# Patient Record
Sex: Female | Born: 1940 | Race: Asian | Hispanic: No | Marital: Married | Smoking: Never smoker
Health system: Southern US, Community
[De-identification: ages and names within clinical notes are randomized; demographics above are authoritative.]

---

## 2004-10-31 ENCOUNTER — Ambulatory Visit: Payer: Self-pay | Admitting: Family Medicine

## 2004-10-31 ENCOUNTER — Ambulatory Visit: Payer: Self-pay | Admitting: *Deleted

## 2004-12-09 ENCOUNTER — Ambulatory Visit: Payer: Self-pay | Admitting: Family Medicine

## 2006-08-05 ENCOUNTER — Encounter: Admission: RE | Admit: 2006-08-05 | Discharge: 2006-08-05 | Payer: Self-pay | Admitting: Cardiovascular Disease

## 2012-10-10 ENCOUNTER — Inpatient Hospital Stay (HOSPITAL_COMMUNITY)
Admission: AD | Admit: 2012-10-10 | Discharge: 2012-10-12 | DRG: 293 | Disposition: A | Discharge status: Home / Self Care | Payer: Medicare Other | Source: Ambulatory Visit | Attending: Cardiovascular Disease | Admitting: Cardiovascular Disease

## 2012-10-10 DIAGNOSIS — I498 Other specified cardiac arrhythmias: Secondary | ICD-10-CM | POA: Diagnosis present

## 2012-10-10 DIAGNOSIS — M25569 Pain in unspecified knee: Secondary | ICD-10-CM | POA: Diagnosis present

## 2012-10-10 DIAGNOSIS — I10 Essential (primary) hypertension: Secondary | ICD-10-CM | POA: Diagnosis present

## 2012-10-10 DIAGNOSIS — I5031 Acute diastolic (congestive) heart failure: Principal | ICD-10-CM | POA: Diagnosis present

## 2012-10-10 DIAGNOSIS — I509 Heart failure, unspecified: Secondary | ICD-10-CM | POA: Diagnosis present

## 2012-10-10 DIAGNOSIS — Z6827 Body mass index (BMI) 27.0-27.9, adult: Secondary | ICD-10-CM

## 2012-10-10 DIAGNOSIS — R011 Cardiac murmur, unspecified: Secondary | ICD-10-CM | POA: Diagnosis present

## 2012-10-10 DIAGNOSIS — Z79899 Other long term (current) drug therapy: Secondary | ICD-10-CM

## 2012-10-10 DIAGNOSIS — M1710 Unilateral primary osteoarthritis, unspecified knee: Secondary | ICD-10-CM | POA: Diagnosis present

## 2012-10-10 DIAGNOSIS — Z7982 Long term (current) use of aspirin: Secondary | ICD-10-CM

## 2012-10-10 DIAGNOSIS — E669 Obesity, unspecified: Secondary | ICD-10-CM | POA: Diagnosis present

## 2012-10-10 LAB — CBC WITH DIFFERENTIAL/PLATELET
Basophils Relative: 0 % (ref 0–1)
Hemoglobin: 11.3 g/dL — ABNORMAL LOW (ref 12.0–15.0)
Lymphs Abs: 2.9 10*3/uL (ref 0.7–4.0)
Monocytes Relative: 6 % (ref 3–12)
Neutro Abs: 5.9 10*3/uL (ref 1.7–7.7)
Neutrophils Relative %: 61 % (ref 43–77)
RBC: 4.26 MIL/uL (ref 3.87–5.11)
WBC: 9.6 10*3/uL (ref 4.0–10.5)

## 2012-10-10 LAB — COMPREHENSIVE METABOLIC PANEL
Albumin: 3.9 g/dL (ref 3.5–5.2)
Alkaline Phosphatase: 60 U/L (ref 39–117)
BUN: 17 mg/dL (ref 6–23)
Chloride: 103 mEq/L (ref 96–112)
Glucose, Bld: 83 mg/dL (ref 70–99)
Potassium: 4.2 mEq/L (ref 3.5–5.1)
Total Bilirubin: 0.3 mg/dL (ref 0.3–1.2)

## 2012-10-10 LAB — TROPONIN I: Troponin I: <0.3 ng/mL (ref <0.30)

## 2012-10-10 LAB — PROTIME-INR: Prothrombin Time: 13.9 seconds (ref 11.6–15.2)

## 2012-10-10 MED ORDER — SODIUM CHLORIDE 0.9 % IJ SOLN
3.0000 mL | INTRAMUSCULAR | Status: DC | PRN
  Administered 2012-10-12: 3 mL via INTRAVENOUS

## 2012-10-10 MED ORDER — HEPARIN SODIUM (PORCINE) 5000 UNIT/ML IJ SOLN
5000.0000 [IU] | Freq: Three times a day (TID) | INTRAMUSCULAR | Status: DC
  Administered 2012-10-10 – 2012-10-12 (×6): 5000 [IU] via SUBCUTANEOUS
  Filled 2012-10-10 (×8): qty 1

## 2012-10-10 MED ORDER — POTASSIUM CHLORIDE CRYS ER 20 MEQ PO TBCR
20.0000 meq | EXTENDED_RELEASE_TABLET | Freq: Two times a day (BID) | ORAL | Status: DC
  Administered 2012-10-10 – 2012-10-12 (×4): 20 meq via ORAL
  Filled 2012-10-10 (×5): qty 1

## 2012-10-10 MED ORDER — SODIUM CHLORIDE 0.9 % IV SOLN: 250.0000 mL | INTRAVENOUS | Status: DC | PRN

## 2012-10-10 MED ORDER — SIMVASTATIN 20 MG PO TABS
20.0000 mg | ORAL_TABLET | Freq: Every day | ORAL | Status: DC
  Administered 2012-10-11 – 2012-10-12 (×2): 20 mg via ORAL
  Filled 2012-10-10 (×2): qty 1

## 2012-10-10 MED ORDER — ONDANSETRON HCL 4 MG/2ML IJ SOLN: 4.0000 mg | Freq: Four times a day (QID) | INTRAMUSCULAR | Status: DC | PRN

## 2012-10-10 MED ORDER — SODIUM CHLORIDE 0.9 % IJ SOLN
3.0000 mL | Freq: Two times a day (BID) | INTRAMUSCULAR | Status: DC
  Administered 2012-10-10 – 2012-10-12 (×4): 3 mL via INTRAVENOUS

## 2012-10-10 MED ORDER — ACETAMINOPHEN 325 MG PO TABS: 650.0000 mg | ORAL_TABLET | ORAL | Status: DC | PRN

## 2012-10-10 MED ORDER — FUROSEMIDE 10 MG/ML IJ SOLN
20.0000 mg | Freq: Two times a day (BID) | INTRAMUSCULAR | Status: DC
  Administered 2012-10-10 – 2012-10-12 (×5): 20 mg via INTRAVENOUS
  Filled 2012-10-10 (×4): qty 2

## 2012-10-10 MED ORDER — ASPIRIN EC 81 MG PO TBEC
81.0000 mg | DELAYED_RELEASE_TABLET | Freq: Every day | ORAL | Status: DC
  Administered 2012-10-10 – 2012-10-12 (×3): 81 mg via ORAL
  Filled 2012-10-10 (×4): qty 1

## 2012-10-10 MED ORDER — FUROSEMIDE 10 MG/ML IJ SOLN
INTRAMUSCULAR | Status: AC
  Filled 2012-10-10: qty 4

## 2012-10-10 MED ORDER — LOSARTAN POTASSIUM 50 MG PO TABS
50.0000 mg | ORAL_TABLET | Freq: Every day | ORAL | Status: DC
  Administered 2012-10-10 – 2012-10-12 (×3): 50 mg via ORAL
  Filled 2012-10-10 (×3): qty 1

## 2012-10-10 MED ORDER — CARVEDILOL 3.125 MG PO TABS
3.1250 mg | ORAL_TABLET | Freq: Two times a day (BID) | ORAL | Status: DC
  Administered 2012-10-11 – 2012-10-12 (×3): 3.125 mg via ORAL
  Filled 2012-10-10 (×5): qty 1

## 2012-10-10 NOTE — H&P (Signed)
Connie Rose is an 72 y.o. female.   Chief Complaint: Leg edema and shortness of breath. HPI: 72 years old female with 1 week history of leg edema and shortness of breath and shaky feeling. No fever. No cough.   Past medical history: No DM, II, + Hypertension. No smoking, No alcohol, no drugs.  No past surgical history on file.  Family history: non-contributory . Social History:  has no tobacco, alcohol, and drug use.  Allergies: Allergies not on file  No prescriptions prior to admission    No results found for this or any previous visit (from the past 48 hour(s)). No results found.  @ROS @ No weight gain, No dentures, No asthma, No chest pain, + shortness of breath. No GI bleed No kidney stone, No stroke or seizures or psych admission.  Blood pressure 151/72, pulse 59, temperature 97.6 F (36.4 C), temperature source Oral, resp. rate 20, height 5\' 4"  (1.626 m), weight 73.7 kg (162 lb 7.7 oz), SpO2 96.00%. GENERAL: She was afebrile. Well built and well nourished  HEENT: Brown eyes, Conjunctivae- pink. Sclera-non-icteric.  NECK: Supple. No JVD. No bruit.  LUNGS: Clear to auscultation. There was no rhonchi or rales.  CARDIOVASCULAR: Rhythm regular. S1 and S2 was normal. There was soft systolic murmur.  ABDOMEN: Soft. Bowel sounds were present. Nontender. Distended.  EXTREMITIES: No clubbing,or cyanosis. 2 + edema.    CNS: Cranial nerves grossly intact. Moves all 4 extremities.   Assessment/Plan Diastolic heart failure, acute Hypertension  Admit/IV lasix/Echocardiogram/R/O MI.  Orpah Cobb S 10/10/2012, 6:08 PM

## 2012-10-10 NOTE — Progress Notes (Signed)
Reported off to night shift RN.  Called down to service response for [employer] dinner tray. Nino Glow RN

## 2012-10-10 NOTE — Progress Notes (Signed)
Pt has been admitted to 4700 and has been placed on telemetry.  Pt currently Sinus Brady with pulse at 59, BP 151/72, Resp 20, 96% on RA.  EKG in progress as ordered.  Pt alert and oriented and appears in no acute distress.  Pt and pt family members have been oriented to unit and call bell is within reach.   Nino Glow RN

## 2012-10-11 ENCOUNTER — Encounter (HOSPITAL_COMMUNITY): Payer: Self-pay

## 2012-10-11 ENCOUNTER — Inpatient Hospital Stay (HOSPITAL_COMMUNITY): Payer: Medicare Other

## 2012-10-11 LAB — BASIC METABOLIC PANEL
CO2: 27 mEq/L (ref 19–32)
Calcium: 9.1 mg/dL (ref 8.4–10.5)
GFR calc Af Amer: 72 mL/min — ABNORMAL LOW (ref >90)
GFR calc non Af Amer: 62 mL/min — ABNORMAL LOW (ref >90)
Sodium: 140 mEq/L (ref 135–145)

## 2012-10-11 LAB — TROPONIN I
Troponin I: <0.3 ng/mL (ref <0.30)
Troponin I: <0.3 ng/mL (ref <0.30)

## 2012-10-11 LAB — TSH: TSH: 3.716 u[IU]/mL (ref 0.350–4.500)

## 2012-10-11 NOTE — Progress Notes (Signed)
Subjective:  Feeling better. Bilateral knee pain  Objective:  Vital Signs in the last 24 hours: Temp:  [97.6 F (36.4 C)-97.9 F (36.6 C)] 97.8 F (36.6 C) (06/17 0612) Pulse Rate:  [50-59] 56 (06/17 0612) Cardiac Rhythm:  [-] Sinus bradycardia (06/16 2014) Resp:  [20] 20 (06/17 0612) BP: (126-151)/(59-72) 140/59 mmHg (06/17 0612) SpO2:  [96 %-99 %] 97 % (06/17 0612) Weight:  [71.8 kg (158 lb 4.6 oz)-73.7 kg (162 lb 7.7 oz)] 71.8 kg (158 lb 4.6 oz) (06/17 0612)  Physical Exam: BP Readings from Last 1 Encounters:  10/11/12 140/59     Wt Readings from Last 1 Encounters:  10/11/12 71.8 kg (158 lb 4.6 oz)    Weight change:   HEENT: [place]/AT, Eyes-Brown, PERL, EOMI, Conjunctiva-Pink, Sclera-Non-icteric Neck: No JVD, No bruit, Trachea midline. Lungs:  Clear, Bilateral. Cardiac:  Regular rhythm, normal S1 and S2, no S3.  Abdomen:  Soft, non-tender. Extremities:  No edema present. No cyanosis. No clubbing. CNS: AxOx3, Cranial nerves grossly intact, moves all 4 extremities. Right handed. Skin: Warm and dry.   Intake/Output from previous day: 06/16 0701 - 06/17 0700 In: 60 [P.O.:60] Out: 1000 [Urine:1000]    Lab Results: BMET    Component Value Date/Time   NA 140 10/11/2012 0710   K 3.9 10/11/2012 0710   CL 102 10/11/2012 0710   CO2 27 10/11/2012 0710   GLUCOSE 106* 10/11/2012 0710   BUN 16 10/11/2012 0710   CREATININE 0.89 10/11/2012 0710   CALCIUM 9.1 10/11/2012 0710   GFRNONAA 62* 10/11/2012 0710   GFRAA 72* 10/11/2012 0710   CBC    Component Value Date/Time   WBC 9.6 10/10/2012 1921   RBC 4.26 10/10/2012 1921   HGB 11.3* 10/10/2012 1921   HCT 35.1* 10/10/2012 1921   PLT 246 10/10/2012 1921   MCV 82.4 10/10/2012 1921   MCH 26.5 10/10/2012 1921   MCHC 32.2 10/10/2012 1921   RDW 13.8 10/10/2012 1921   LYMPHSABS 2.9 10/10/2012 1921   MONOABS 0.6 10/10/2012 1921   EOSABS 0.3 10/10/2012 1921   BASOSABS 0.0 10/10/2012 1921   CARDIAC ENZYMES Lab Results  Component Value Date   TROPONINI <0.30 10/11/2012    Scheduled Meds: . aspirin EC  81 mg Oral Daily  . carvedilol  3.125 mg Oral BID WC  . furosemide      . furosemide  20 mg Intravenous Q12H  . heparin  5,000 Units Subcutaneous Q8H  . losartan  50 mg Oral Daily  . potassium chloride  20 mEq Oral BID  . simvastatin  20 mg Oral q1800  . sodium chloride  3 mL Intravenous Q12H   Continuous Infusions:  PRN Meds:.sodium chloride, acetaminophen, ondansetron (ZOFRAN) IV, sodium chloride  Assessment/Plan: Diastolic heart failure, acute  Hypertension Bilateral knee pain/arthritis  Awaiting 2-D echocardiogram.   LOS: 1 day    Orpah Cobb  MD  10/11/2012, 9:03 AM

## 2012-10-11 NOTE — Progress Notes (Signed)
  Echocardiogram 2D Echocardiogram has been performed.  Cathie Beams 10/11/2012, 12:18 PM

## 2012-10-11 NOTE — Progress Notes (Signed)
UR Chart Review Completed  

## 2012-10-11 NOTE — Progress Notes (Signed)
Patient walking in room today without assistance.  Patient had xray's done of chest, and bilateral knees today.  Diuresing patient today.  Patient tolerating well.

## 2012-10-12 ENCOUNTER — Inpatient Hospital Stay (HOSPITAL_COMMUNITY): Payer: Medicare Other

## 2012-10-12 LAB — BASIC METABOLIC PANEL
GFR calc Af Amer: 76 mL/min — ABNORMAL LOW (ref >90)
GFR calc non Af Amer: 66 mL/min — ABNORMAL LOW (ref >90)
Potassium: 4.1 mEq/L (ref 3.5–5.1)
Sodium: 136 mEq/L (ref 135–145)

## 2012-10-12 MED ORDER — TECHNETIUM TC 99M SESTAMIBI GENERIC - CARDIOLITE
10.0000 | Freq: Once | INTRAVENOUS | Status: AC | PRN
  Administered 2012-10-12: 10 via INTRAVENOUS

## 2012-10-12 MED ORDER — TECHNETIUM TC 99M SESTAMIBI - CARDIOLITE
30.0000 | Freq: Once | INTRAVENOUS | Status: AC | PRN
  Administered 2012-10-12: 30 via INTRAVENOUS

## 2012-10-12 MED ORDER — FUROSEMIDE 20 MG PO TABS: 20.0000 mg | ORAL_TABLET | Freq: Every day | ORAL | Status: AC

## 2012-10-12 MED ORDER — ATENOLOL 25 MG PO TABS: 12.5000 mg | ORAL_TABLET | Freq: Every day | ORAL | Status: AC

## 2012-10-12 MED ORDER — POTASSIUM CHLORIDE CRYS ER 20 MEQ PO TBCR: 20.0000 meq | EXTENDED_RELEASE_TABLET | Freq: Every day | ORAL | Status: DC

## 2012-10-12 MED ORDER — REGADENOSON 0.4 MG/5ML IV SOLN
0.4000 mg | Freq: Once | INTRAVENOUS | Status: AC
  Administered 2012-10-12: 0.4 mg via INTRAVENOUS

## 2012-10-12 NOTE — Progress Notes (Signed)
Pt O4x, No complains. Son at bed side

## 2012-10-12 NOTE — Progress Notes (Signed)
Pt d/c home with family. D/c instructions and medications  reviewed with son translating. PT and son state understanding. All Pt and son questions answered

## 2012-10-12 NOTE — Plan of Care (Signed)
Problem: Phase I Progression Outcomes Goal: EF % per last Echo/documented,Core Reminder form on chart Outcome: Completed/Met Date Met:  10/12/12 EF per last echo on 10/11/2012 showed EF of 60-65%.

## 2012-10-12 NOTE — Discharge Summary (Signed)
Physician Discharge Summary  Patient ID: Connie Rose MRN: [id number] DOB/AGE: [birth year] 72 y.o.  Admit date: 10/10/2012 Discharge date: 10/12/2012  Admission Diagnoses: Diastolic heart failure, acute  Hypertension  Bilateral knee pain/arthritis  Discharge Diagnoses:  Principle Problem:   * Diastolic heart failure, acute * Hypertension  Bilateral knee pain/arthritis Obesity  Discharged Condition: good  Hospital Course: 72 years old female with 1 week history of leg edema and shortness of breath and shaky feeling had diastolic heart failure. She responded well to lasix, B-blocker and calcium channel blocker and Ace inhibitor.therapy. She underwent nuclear stress test that failed to show significant reversible ischemia except basal septal area. She wants to continue medical therapy for now. Her B-blocker dose was reduced by 50 % for sinus bradycardia. She will have GI referral on OP basis.  Consults: None  Significant Diagnostic Studies: labs: Near normal CBC and BMET.  EKG-Sinus bradycardia.  Near normal nuclear stress test.  Echocardiogram positive for diastolic dysfunction with good LV systolic function.  X-ray knees without acute disease + degenerative changes.  Treatments: cardiac meds: carvedilol, amlodipine and furosemide  Discharge Exam: Blood pressure 115/54, pulse 66, temperature 97.6 F (36.4 C), temperature source Oral, resp. rate 18, height 5\' 4"  (1.626 m), weight 71.668 kg (158 lb), SpO2 96.00%. HEENT: [place]/AT, Eyes-Brown, PERL, EOMI, Conjunctiva-Pink, Sclera-Non-icteric  Neck: No JVD, No bruit, Trachea midline.  Lungs: Clear, Bilateral.  Cardiac: Regular rhythm, normal S1 and S2, no S3.  Abdomen: Soft, non-tender.  Extremities: No edema present. No cyanosis. No clubbing. Mild tenderness of both knees. CNS: AxOx3, Cranial nerves grossly intact, moves all 4 extremities. Right handed.  Skin: Warm and dry.   Disposition: 01, Home or self care.      Medication List    TAKE these medications       amLODipine 5 MG tablet  Commonly known as:  NORVASC  Take 5 mg by mouth daily.     aspirin EC 81 MG tablet  Take 81 mg by mouth daily.     atenolol 25 MG tablet  Commonly known as:  TENORMIN  Take 0.5 tablets (12.5 mg total) by mouth daily.     furosemide 20 MG tablet  Commonly known as:  LASIX  Take 1 tablet (20 mg total) by mouth daily.     HYDROcodone-acetaminophen 5-325 MG per tablet  Commonly known as:  NORCO/VICODIN  Take 1 tablet by mouth daily as needed for pain.     lisinopril 10 MG tablet  Commonly known as:  PRINIVIL,ZESTRIL  Take 10 mg by mouth daily.     potassium chloride SA 20 MEQ tablet  Commonly known as:  K-DUR,KLOR-CON  Take 1 tablet (20 mEq total) by mouth daily.     simvastatin 20 MG tablet  Commonly known as:  ZOCOR  Take 20 mg by mouth every evening.     Vitamin D3 2000 UNITS capsule  Take 2,000 Units by mouth daily.           Follow-up Information   Follow up with [facility] S, MD. Schedule an appointment as soon as possible for a visit in 1 week.   Contact information:   [address] [place] [address] [phone]       Signed: Ricki Rodriguez 10/12/2012, 6:23 PM

## 2012-10-13 NOTE — Progress Notes (Signed)
   CARE MANAGEMENT NOTE 10/13/2012  Patient:  Connie Rose, Connie Rose   Account Number:  [id number]  Date Initiated:  10/13/2012  Documentation initiated by:  Tera Mater  Subjective/Objective Assessment:   72yo female admitted with CHF.  Pt. lives with her spouse. Pt. does not speak [place].     Action/Plan:   discharge planning   Anticipated DC Date:  10/13/2012   Anticipated DC Plan:  HOME/SELF CARE      DC Planning Services  CM consult      Choice offered to / List presented to:             Status of service:  Completed, signed off Medicare Important Message given?   (If response is "NO", the following Medicare IM given date fields will be blank) Date Medicare IM given:   Date Additional Medicare IM given:    Discharge Disposition:  HOME/SELF CARE  Per UR Regulation:  Reviewed for med. necessity/level of care/duration of stay  If discussed at Long Length of Stay Meetings, dates discussed:    Comments:  10/12/12 1300 In to complete heart failure home health screen with pt. and grandson.  Pt. does not speak English, grandson interpeting.  Explained to grandson about home health services.  At this time, the pt. did not want any home health services. Tera Mater, RN, BSN NCM [phone]

## 2014-10-17 IMAGING — CR DG KNEE 1-2V*R*
2 series · 2 of 2 positions shown · non-contrast
Comparison: 08/05/2006

CLINICAL DATA: Bilateral knee pain and swelling

RIGHT KNEE - 1-2 VIEW

[t knee ap right]
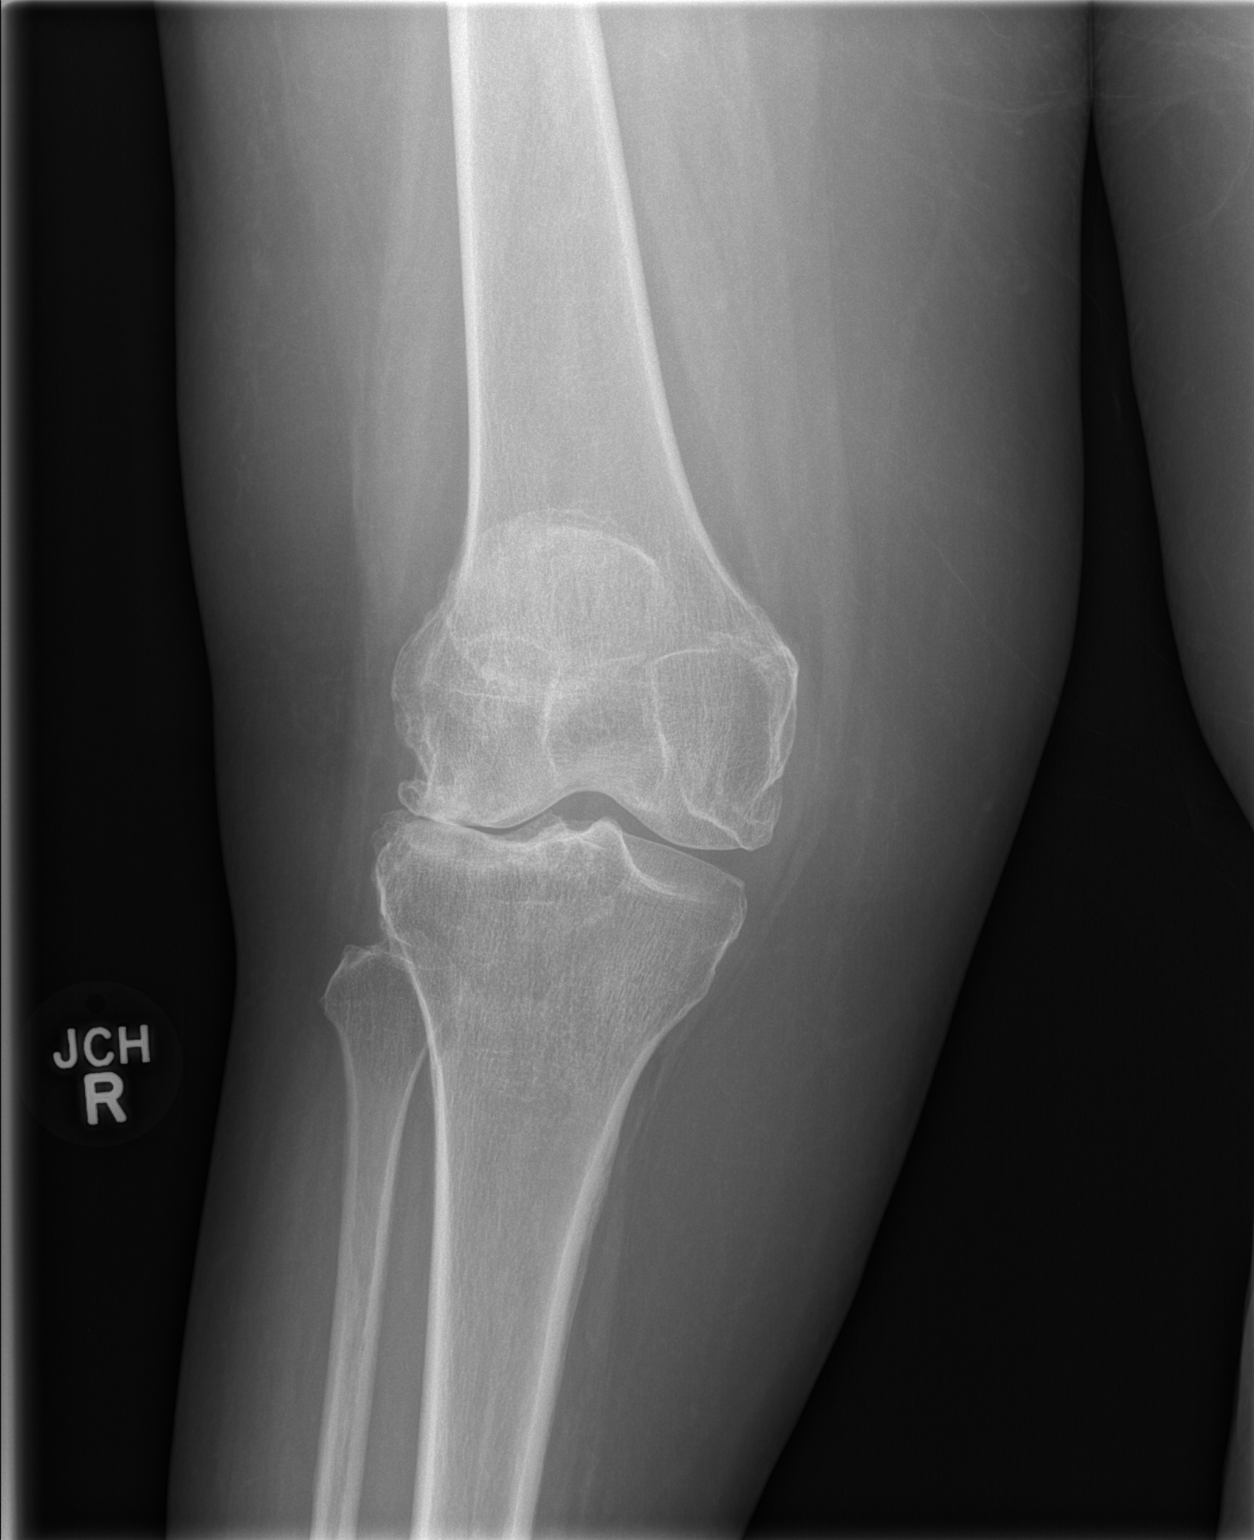

[x knee lat right]
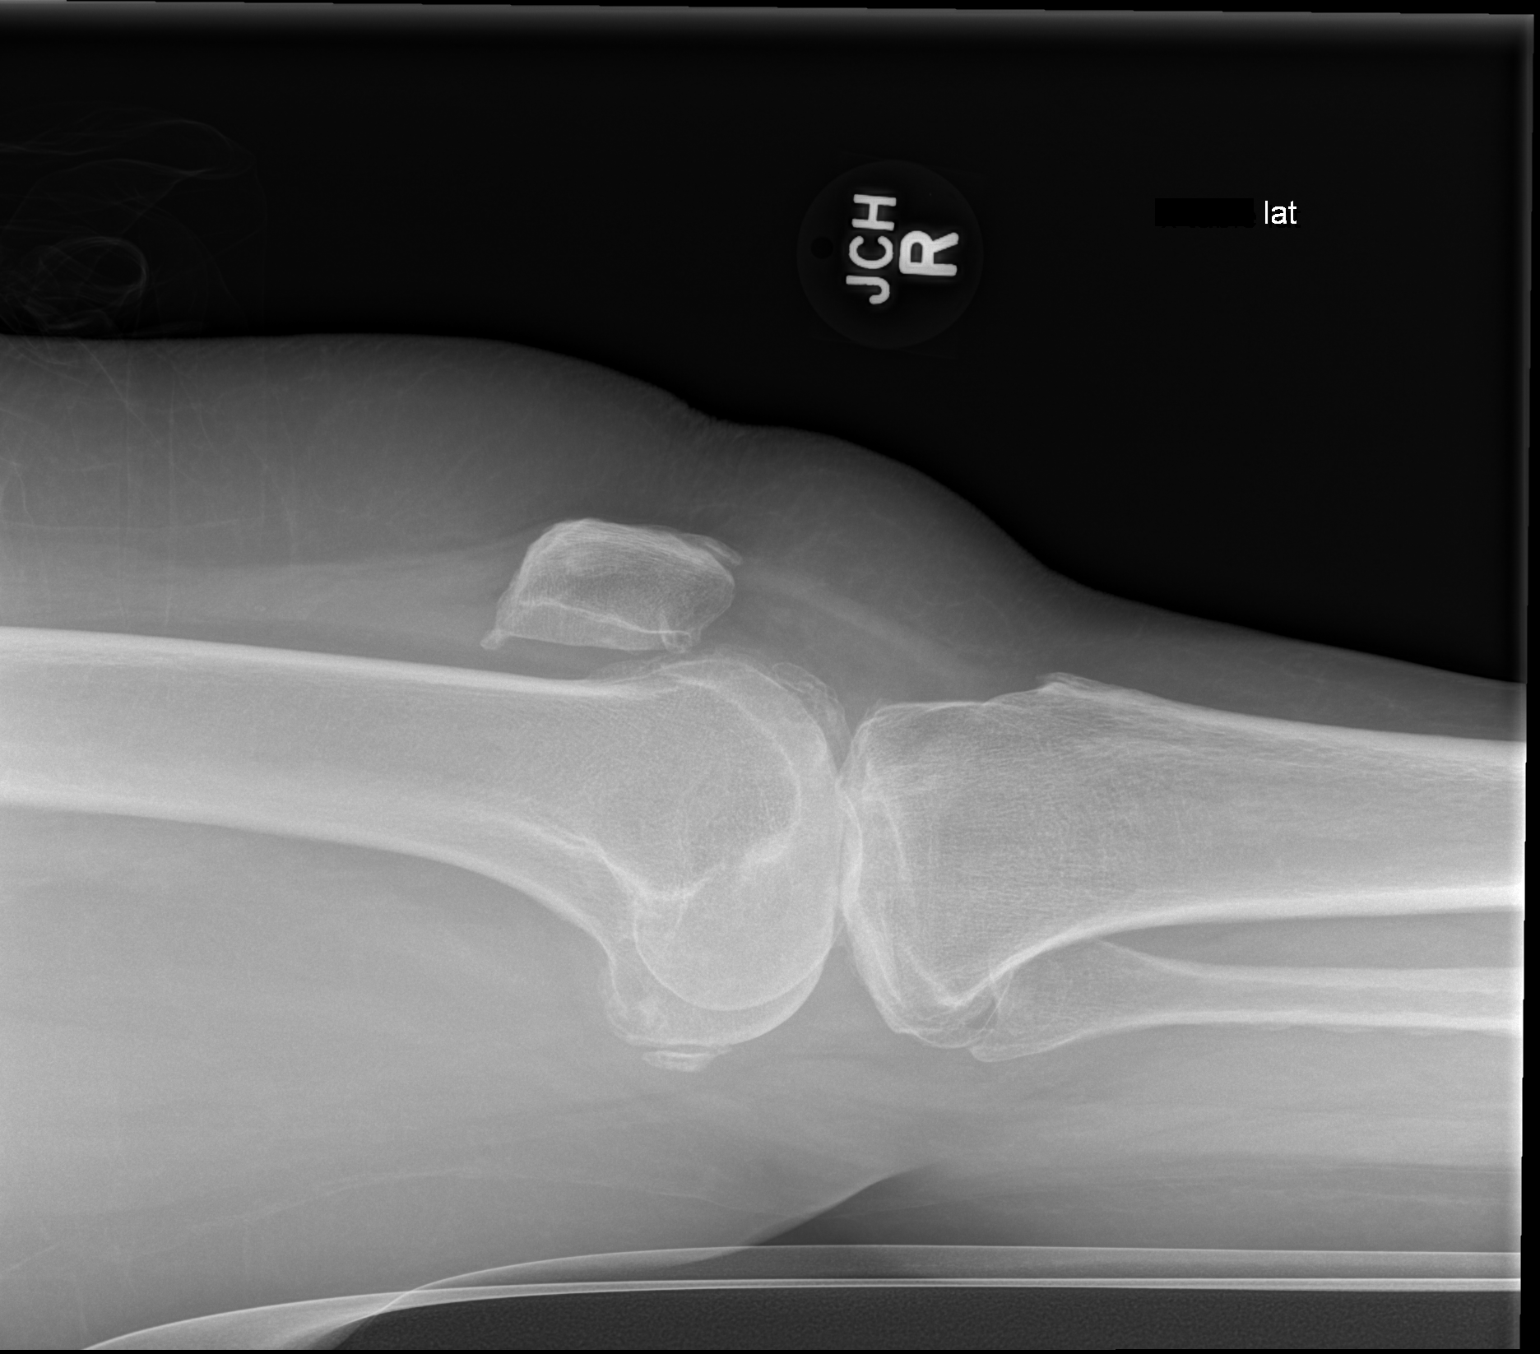

[2 of 2 positions shown; findings below may reference images not displayed]

FINDINGS: Degenerative changes of the knee joint are noted most
marked in the lateral joint space.  No joint effusion is seen.  No
acute abnormality is noted.
IMPRESSION: Chronic changes without acute abnormality.

## 2019-07-11 ENCOUNTER — Other Ambulatory Visit: Payer: Self-pay

## 2019-07-11 ENCOUNTER — Encounter (HOSPITAL_COMMUNITY): Payer: Self-pay

## 2019-07-11 ENCOUNTER — Observation Stay (HOSPITAL_COMMUNITY)
Admission: EM | Admit: 2019-07-11 | Discharge: 2019-07-13 | Disposition: A | Discharge status: Home / Self Care | Payer: Medicare Other | Attending: Cardiovascular Disease | Admitting: Cardiovascular Disease

## 2019-07-11 DIAGNOSIS — D649 Anemia, unspecified: Principal | ICD-10-CM | POA: Diagnosis present

## 2019-07-11 DIAGNOSIS — Z79899 Other long term (current) drug therapy: Secondary | ICD-10-CM | POA: Diagnosis not present

## 2019-07-11 DIAGNOSIS — R710 Precipitous drop in hematocrit: Secondary | ICD-10-CM | POA: Diagnosis present

## 2019-07-11 DIAGNOSIS — Z7982 Long term (current) use of aspirin: Secondary | ICD-10-CM | POA: Insufficient documentation

## 2019-07-11 HISTORY — DX: Anemia, unspecified: D64.9

## 2019-07-11 LAB — CBC
HCT: 18.5 % — ABNORMAL LOW (ref 36.0–46.0)
Hemoglobin: 4.5 g/dL — CL (ref 12.0–15.0)
MCH: 15.4 pg — ABNORMAL LOW (ref 26.0–34.0)
MCHC: 24.3 g/dL — ABNORMAL LOW (ref 30.0–36.0)
MCV: 63.1 fL — ABNORMAL LOW (ref 80.0–100.0)
Platelets: 331 10*3/uL (ref 150–400)
RBC: 2.93 MIL/uL — ABNORMAL LOW (ref 3.87–5.11)
RDW: 21.1 % — ABNORMAL HIGH (ref 11.5–15.5)
WBC: 12.5 10*3/uL — ABNORMAL HIGH (ref 4.0–10.5)
nRBC: 0.3 % — ABNORMAL HIGH (ref 0.0–0.2)

## 2019-07-11 LAB — COMPREHENSIVE METABOLIC PANEL
ALT: 16 U/L (ref 0–44)
AST: 19 U/L (ref 15–41)
Albumin: 3.5 g/dL (ref 3.5–5.0)
Alkaline Phosphatase: 57 U/L (ref 38–126)
Anion gap: 9 (ref 5–15)
BUN: 22 mg/dL (ref 8–23)
CO2: 24 mmol/L (ref 22–32)
Calcium: 9.1 mg/dL (ref 8.9–10.3)
Chloride: 101 mmol/L (ref 98–111)
Creatinine, Ser: 0.93 mg/dL (ref 0.44–1.00)
GFR calc Af Amer: >60 mL/min (ref >60)
GFR calc non Af Amer: 58 mL/min — ABNORMAL LOW (ref >60)
Glucose, Bld: 214 mg/dL — ABNORMAL HIGH (ref 70–99)
Potassium: 4.2 mmol/L (ref 3.5–5.1)
Sodium: 134 mmol/L — ABNORMAL LOW (ref 135–145)
Total Bilirubin: 0.7 mg/dL (ref 0.3–1.2)
Total Protein: 6.3 g/dL — ABNORMAL LOW (ref 6.5–8.1)

## 2019-07-11 LAB — PROTIME-INR
INR: 1.1 (ref 0.8–1.2)
Prothrombin Time: 14.1 seconds (ref 11.4–15.2)

## 2019-07-11 LAB — POC OCCULT BLOOD, ED: Fecal Occult Bld: NEGATIVE

## 2019-07-11 LAB — PREPARE RBC (CROSSMATCH)

## 2019-07-11 LAB — FERRITIN: Ferritin: 3 ng/mL — ABNORMAL LOW (ref 11–307)

## 2019-07-11 LAB — IRON AND TIBC
Iron: 11 ug/dL — ABNORMAL LOW (ref 28–170)
Saturation Ratios: 2 % — ABNORMAL LOW (ref 10.4–31.8)
TIBC: 549 ug/dL — ABNORMAL HIGH (ref 250–450)
UIBC: 538 ug/dL

## 2019-07-11 LAB — VITAMIN B12: Vitamin B-12: 3706 pg/mL — ABNORMAL HIGH (ref 180–914)

## 2019-07-11 LAB — RETICULOCYTES
Immature Retic Fract: 30.3 % — ABNORMAL HIGH (ref 2.3–15.9)
RBC.: 2.82 MIL/uL — ABNORMAL LOW (ref 3.87–5.11)
Retic Count, Absolute: 107.4 10*3/uL (ref 19.0–186.0)
Retic Ct Pct: 3.8 % — ABNORMAL HIGH (ref 0.4–3.1)

## 2019-07-11 LAB — FOLATE: Folate: 44.1 ng/mL (ref >5.9)

## 2019-07-11 LAB — ABO/RH: ABO/RH(D): B NEG

## 2019-07-11 MED ORDER — LOSARTAN POTASSIUM 50 MG PO TABS
50.0000 mg | ORAL_TABLET | Freq: Every day | ORAL | Status: DC
  Administered 2019-07-12 – 2019-07-13 (×2): 50 mg via ORAL
  Filled 2019-07-11 (×2): qty 1

## 2019-07-11 MED ORDER — PANTOPRAZOLE SODIUM 40 MG IV SOLR
40.0000 mg | Freq: Two times a day (BID) | INTRAVENOUS | Status: DC
  Administered 2019-07-11 – 2019-07-12 (×3): 40 mg via INTRAVENOUS
  Filled 2019-07-11 (×3): qty 40

## 2019-07-11 MED ORDER — VITAMIN D 25 MCG (1000 UNIT) PO TABS
2000.0000 [IU] | ORAL_TABLET | Freq: Every day | ORAL | Status: DC
  Administered 2019-07-12 – 2019-07-13 (×2): 2000 [IU] via ORAL
  Filled 2019-07-11 (×2): qty 2

## 2019-07-11 MED ORDER — ATENOLOL 25 MG PO TABS
12.5000 mg | ORAL_TABLET | Freq: Every day | ORAL | Status: DC
  Administered 2019-07-12 – 2019-07-13 (×2): 12.5 mg via ORAL
  Filled 2019-07-11 (×2): qty 1

## 2019-07-11 MED ORDER — ATORVASTATIN CALCIUM 10 MG PO TABS
20.0000 mg | ORAL_TABLET | Freq: Every day | ORAL | Status: DC
  Administered 2019-07-12 – 2019-07-13 (×2): 20 mg via ORAL
  Filled 2019-07-11 (×2): qty 2

## 2019-07-11 MED ORDER — LISINOPRIL 10 MG PO TABS: 10.0000 mg | ORAL_TABLET | Freq: Every day | ORAL | Status: DC

## 2019-07-11 MED ORDER — SODIUM CHLORIDE 0.9 % IV SOLN: INTRAVENOUS | Status: DC

## 2019-07-11 MED ORDER — AMLODIPINE BESYLATE 5 MG PO TABS
5.0000 mg | ORAL_TABLET | Freq: Every day | ORAL | Status: DC
  Administered 2019-07-12 – 2019-07-13 (×2): 5 mg via ORAL
  Filled 2019-07-11 (×2): qty 1

## 2019-07-11 NOTE — ED Triage Notes (Addendum)
Per son pt went to PCP last week for routine check up sent her for lab work, called today with a Hgb 4. Sent pt to ED for further evaluation. Pt denies any pain, reports some bloody stool 4 months ago but not recent

## 2019-07-11 NOTE — H&P (Signed)
Referring Physician:   JAYLEAN BUENAVENTURA is an 79 y.o. female.                       Chief Complaint: Weakness and dizziness  HPI: 79 years old Asian female had remote GI bleed 4 months ago and has noticed dark stools for several days has weakness and dizziness. She admits to small dose aspirin use daily for many years but lately started taking Aleve once or twice daily x 1 week for increasing right knee pain before that she used to take Aleve once or twice a week as needed.  She denies chest pain and abdominal pain. Her rectal exam by ED physician is negative for occult blood. Her Hgb is down to 4.5 gm. Her blood sugar is elevated at 214 mg. Rest of the BMET and LFT are essentially unremarkable. She has PMH of hypertension, type 2 diabetes mellitus and obesity.   Past Medical History:  Diagnosis Date  . Symptomatic anemia 07/11/2019      No past surgical history on file.  No family history on file. Social History:  reports that she has never smoked. She has never used smokeless tobacco. She reports that she does not use drugs. No history on file for alcohol.  Allergies: No Known Allergies  (Not in a hospital admission)   Results for orders placed or performed during the hospital encounter of 07/11/19 (from the past 48 hour(s))  Comprehensive metabolic panel     Status: Abnormal   Collection Time: 07/11/19  7:14 PM  Result Value Ref Range   Sodium 134 (L) 135 - 145 mmol/L   Potassium 4.2 3.5 - 5.1 mmol/L   Chloride 101 98 - 111 mmol/L   CO2 24 22 - 32 mmol/L   Glucose, Bld 214 (H) 70 - 99 mg/dL    Comment: Glucose reference range applies only to samples taken after fasting for at least 8 hours.   BUN 22 8 - 23 mg/dL   Creatinine, Ser 5.85 0.44 - 1.00 mg/dL   Calcium 9.1 8.9 - 27.7 mg/dL   Total Protein 6.3 (L) 6.5 - 8.1 g/dL   Albumin 3.5 3.5 - 5.0 g/dL   AST 19 15 - 41 U/L   ALT 16 0 - 44 U/L   Alkaline Phosphatase 57 38 - 126 U/L   Total Bilirubin 0.7 0.3 - 1.2 mg/dL   GFR  calc non Af Amer 58 (L) >60 mL/min   GFR calc Af Amer >60 >60 mL/min   Anion gap 9 5 - 15    Comment: Performed at [facility], [address], [place], [place] [address]  CBC     Status: Abnormal   Collection Time: 07/11/19  7:14 PM  Result Value Ref Range   WBC 12.5 (H) 4.0 - 10.5 K/uL   RBC 2.93 (L) 3.87 - 5.11 MIL/uL   Hemoglobin 4.5 (LL) 12.0 - 15.0 g/dL    Comment: REPEATED TO VERIFY Reticulocyte Hemoglobin testing may be clinically indicated, consider ordering this additional test [id number] THIS CRITICAL RESULT HAS VERIFIED AND BEEN CALLED TO J.GOOTER RN BY KATHERINE MCCORMICK ON 03 16 2021 AT 1949, AND HAS BEEN READ BACK.     HCT 18.5 (L) 36.0 - 46.0 %   MCV 63.1 (L) 80.0 - 100.0 fL   MCH 15.4 (L) 26.0 - 34.0 pg   MCHC 24.3 (L) 30.0 - 36.0 g/dL   RDW 54.0 (H) 08.6 - 76.1 %  Platelets 331 150 - 400 K/uL    Comment: REPEATED TO VERIFY   nRBC 0.3 (H) 0.0 - 0.2 %    Comment: Performed at [facility], [place] [address] [place], [place] [address]  Type and screen [place]     Status: None (Preliminary result)   Collection Time: 07/11/19  7:14 PM  Result Value Ref Range   ABO/RH(D) PENDING    Antibody Screen PENDING    Sample Expiration      07/14/2019,2359 Performed at [facility], [place] [address], [place], [place] [address]    No results found.  Review Of Systems Constitutional: No fever, chills, weight loss or gain. Eyes: No vision change, wears glasses. No discharge or pain. Ears: No hearing loss, No tinnitus. Respiratory: No asthma, COPD, pneumonias. No shortness of breath. No hemoptysis. Cardiovascular: No chest pain, palpitation, leg edema. Gastrointestinal: No nausea, vomiting, diarrhea, constipation. Positive GI bleed. No hepatitis. Genitourinary: No dysuria, hematuria, kidney stone. No incontinance. Neurological: No headache, stroke, seizures.  Psychiatry: No psych facility admission for anxiety, depression, suicide. No  detox. Skin: No rash. Musculoskeletal: Positive joint pain, fibromyalgia. No neck pain, back pain. Lymphadenopathy: No lymphadenopathy. Hematology: No anemia or easy bruising.   Blood pressure 139/80, pulse 97, temperature 98.1 F (36.7 C), temperature source Oral, resp. rate 18, SpO2 100 %. There is no height or weight on file to calculate BMI. General appearance: alert, cooperative, appears stated age and no distress Head: Normocephalic, atraumatic. Eyes: Brown eyes, pale conjunctiva, corneas clear. PERRL, EOM's intact. Neck: No adenopathy, no carotid bruit, no JVD, supple, symmetrical, trachea midline and thyroid not enlarged. Resp: Clear to auscultation bilaterally. Cardio: Regular rate and rhythm, S1, S2 normal, II/VI systolic murmur, no click, rub or gallop GI: Soft, non-tender; bowel sounds normal; no organomegaly. Extremities: No edema, cyanosis or clubbing. Skin: Warm and dry.  Neurologic: Alert and oriented X 3, normal strength. Normal coordination and gait.  Assessment/Plan Acute on chronic GI bleed Symptomatic anemia Type 2 DM HTN Obesity  Admit. Transfuse PRBCs. IV fluids. Home medications as needed. GI consulted by ED.  Time spent: Review of old records, Lab, x-rays, EKG, other cardiac tests, examination, discussion with patient, Nurse, ED physician over 70 minutes.  Birdie Riddle, MD  07/11/2019, 8:36 PM

## 2019-07-11 NOTE — ED Notes (Signed)
Dr. Algie Coffer would like to be notified once CBC and CMP are resulted.  [phone]

## 2019-07-11 NOTE — ED Notes (Signed)
[name] [employer] [phone]

## 2019-07-11 NOTE — ED Provider Notes (Signed)
[facility] EMERGENCY DEPARTMENT Provider Note   CSN: [id number] Arrival date & time: 07/11/19  1806     History Chief Complaint  Patient presents with  . Abnormal Lab    Connie Rose is a 79 y.o. female.  A language interpreter was used.        Connie Rose is a 79 y.o. female, patient with no diagnosed medical history, presenting to the ED with anemia noted from an office visit.  She states she was having routine lab work to establish care with Dr. Doylene Canard, results returned today with hemoglobin of 4. She notes some shortness of breath and generalized weakness for at least the last 2 weeks. She has had dark stools for the last 10 to 15 days with 1 stool a day. Naproxen weekly for last few months, daily for last week or so. Denies use of tobacco or alcohol.  Denies anticoagulation. Denies fever/chills, abdominal pain, nausea/vomiting, chest pain, dizziness, syncope, urinary symptoms, or any other complaints.   Past Medical History:  Diagnosis Date  . Symptomatic anemia 07/11/2019    Patient Active Problem List   Diagnosis Date Noted  . Symptomatic anemia 07/11/2019    No past surgical history on file.   OB History   No obstetric history on file.     No family history on file.  Social History   Tobacco Use  . Smoking status: Never Smoker  . Smokeless tobacco: Never Used  Substance Use Topics  . Alcohol use: Not on file  . Drug use: No    Home Medications Prior to Admission medications   Medication Sig Start Date End Date Taking? Authorizing Provider  amLODipine (NORVASC) 5 MG tablet Take 5 mg by mouth daily.   Yes [provider]  aspirin EC 81 MG tablet Take 81 mg by mouth daily.   Yes [provider]  atenolol (TENORMIN) 25 MG tablet Take 0.5 tablets (12.5 mg total) by mouth daily. 10/12/12  Yes Dixie Dials, MD  atorvastatin (LIPITOR) 20 MG tablet Take 20 mg by mouth daily.   Yes [provider]    furosemide (LASIX) 20 MG tablet Take 1 tablet (20 mg total) by mouth daily. 10/12/12  Yes Dixie Dials, MD  losartan (COZAAR) 50 MG tablet Take 50 mg by mouth daily.   Yes [provider]  potassium chloride (KLOR-CON) 10 MEQ tablet Take 10 mEq by mouth daily.   Yes [provider]  Cholecalciferol (VITAMIN D3) 2000 UNITS capsule Take 2,000 Units by mouth daily.    [provider]  potassium chloride SA (K-DUR,KLOR-CON) 20 MEQ tablet Take 1 tablet (20 mEq total) by mouth daily. Patient not taking: Reported on 07/11/2019 10/12/12   Dixie Dials, MD    Allergies    Patient has no known allergies.  Review of Systems   Review of Systems  Constitutional: Negative for chills, diaphoresis and fever.  Respiratory: Positive for shortness of breath. Negative for cough.   Cardiovascular: Negative for chest pain.  Gastrointestinal: Negative for abdominal pain, diarrhea, nausea and vomiting.       Dark stools  Genitourinary: Negative for hematuria and vaginal bleeding.  Neurological: Positive for weakness. Negative for syncope.  All other systems reviewed and are negative.   Physical Exam Updated Vital Signs BP 139/80 (BP Location: Right Arm)   Pulse 97   Temp 98.1 F (36.7 C) (Oral)   Resp 18   SpO2 100%   Physical Exam Vitals and nursing note  reviewed.  Constitutional:      General: She is not in acute distress.    Appearance: She is well-developed. She is not diaphoretic.  HENT:     Head: Normocephalic and atraumatic.     Mouth/Throat:     Mouth: Mucous membranes are moist.     Pharynx: Oropharynx is clear.  Eyes:     Conjunctiva/sclera: Conjunctivae normal.  Cardiovascular:     Rate and Rhythm: Normal rate and regular rhythm.     Pulses: Normal pulses.          Radial pulses are 2+ on the right side and 2+ on the left side.       Posterior tibial pulses are 2+ on the right side and 2+ on the left side.     Heart sounds: Normal heart sounds.      Comments: Tactile temperature in the extremities appropriate and equal bilaterally. Pulmonary:     Effort: Pulmonary effort is normal. No respiratory distress.     Breath sounds: Normal breath sounds.  Abdominal:     Palpations: Abdomen is soft.     Tenderness: There is no abdominal tenderness. There is no guarding.  Genitourinary:    Rectum: Guaiac result negative.     Comments: Rectal Exam:  No external hemorrhoids, fissures, or lesions noted.  No frank blood or melena. No stool burden.  No rectal tenderness. No foreign bodies noted.   Musculoskeletal:     Cervical back: Neck supple.     Right lower leg: No edema.     Left lower leg: No edema.  Lymphadenopathy:     Cervical: No cervical adenopathy.  Skin:    General: Skin is warm and dry.     Coloration: Skin is pale.     Comments: Conjunctival pallor.  Intraoral pallor.  Neurological:     Mental Status: She is alert.  Psychiatric:        Mood and Affect: Mood and affect normal.        Speech: Speech normal.        Behavior: Behavior normal.     ED Results / Procedures / Treatments   Labs (all labs ordered are listed, but only abnormal results are displayed) Labs Reviewed  COMPREHENSIVE METABOLIC PANEL - Abnormal; Notable for the following components:      Result Value   Sodium 134 (*)    Glucose, Bld 214 (*)    Total Protein 6.3 (*)    GFR calc non Af Amer 58 (*)    All other components within normal limits  CBC - Abnormal; Notable for the following components:   WBC 12.5 (*)    RBC 2.93 (*)    Hemoglobin 4.5 (*)    HCT 18.5 (*)    MCV 63.1 (*)    MCH 15.4 (*)    MCHC 24.3 (*)    RDW 21.1 (*)    nRBC 0.3 (*)    All other components within normal limits  RETICULOCYTES - Abnormal; Notable for the following components:   Retic Ct Pct 3.8 (*)    RBC. 2.82 (*)    Immature Retic Fract 30.3 (*)    All other components within normal limits  PROTIME-INR  BASIC METABOLIC PANEL  CBC  VITAMIN B12  FOLATE  IRON  AND TIBC  FERRITIN  POC OCCULT BLOOD, ED  TYPE AND SCREEN  PREPARE RBC (CROSSMATCH)  ABO/RH    EKG None  Radiology No results found.  Procedures .Critical Care Performed  by: Anselm Pancoast, PA-C Authorized by: Anselm Pancoast, PA-C   Critical care provider statement:    Critical care time (minutes):  35   Critical care time was exclusive of:  Separately billable procedures and treating other patients   Critical care was necessary to treat or prevent imminent or life-threatening deterioration of the following conditions: Symptomatic anemia requiring transfusion.   Critical care was time spent personally by me on the following activities:  Development of treatment plan with patient or surrogate, ordering and performing treatments and interventions, ordering and review of laboratory studies, ordering and review of radiographic studies, pulse oximetry, re-evaluation of patient's condition, examination of patient, evaluation of patient's response to treatment and discussions with consultants   I assumed direction of critical care for this patient from another provider in my specialty: no     (including critical care time)  Medications Ordered in ED Medications  0.9 %  sodium chloride infusion ( Intravenous New Bag/Given 07/11/19 2056)  amLODipine (NORVASC) tablet 5 mg (has no administration in time range)  atenolol (TENORMIN) tablet 12.5 mg (has no administration in time range)  cholecalciferol (VITAMIN D3) tablet 2,000 Units (has no administration in time range)  pantoprazole (PROTONIX) injection 40 mg (40 mg Intravenous Given 07/11/19 2208)  atorvastatin (LIPITOR) tablet 20 mg (has no administration in time range)  losartan (COZAAR) tablet 50 mg (has no administration in time range)    ED Course  I have reviewed the triage vital signs and the nursing notes.  Pertinent labs & imaging results that were available during my care of the patient were reviewed by me and considered in my  medical decision making (see chart for details).  Clinical Course as of Jul 11 2242  Tue Jul 11, 2019  2015 Spoke with Dr. Algie Coffer at the bedside.  He states he will handle patient's admission and ordering of transfusion.   [SJ]  2056 Dr. Matthias Hughs, [facility].  States he would be happy to consult on this patient.  He will plan on seeing the patient in the morning.  Requests patient be n.p.o. after midnight.  40 mg Protonix IV every 12 hours. Requests an anemia panel.   [SJ]    Clinical Course User Index [SJ] Eudelia Hiltunen, Hillard Danker, PA-C   MDM Rules/Calculators/A&P                      Patient presents with anemia.  Symptomatic with shortness of breath and generalized weakness.  Pallor on exam. Hemoglobin of 4.5 requiring transfusion.  Source may be chronic GI bleed. Patient transfused and admitted for further management.  Findings and plan of care discussed with Marguarite Arbour, MD.   Final Clinical Impression(s) / ED Diagnoses Final diagnoses:  Symptomatic anemia    Rx / DC Orders ED Discharge Orders    None       Concepcion Living 07/11/19 2245    Terald Sleeper, MD 07/12/19 1340

## 2019-07-12 DIAGNOSIS — D649 Anemia, unspecified: Secondary | ICD-10-CM | POA: Diagnosis not present

## 2019-07-12 LAB — BASIC METABOLIC PANEL
Anion gap: 9 (ref 5–15)
BUN: 22 mg/dL (ref 8–23)
CO2: 21 mmol/L — ABNORMAL LOW (ref 22–32)
Calcium: 8.6 mg/dL — ABNORMAL LOW (ref 8.9–10.3)
Chloride: 105 mmol/L (ref 98–111)
Creatinine, Ser: 0.86 mg/dL (ref 0.44–1.00)
GFR calc Af Amer: >60 mL/min (ref >60)
GFR calc non Af Amer: >60 mL/min (ref >60)
Glucose, Bld: 149 mg/dL — ABNORMAL HIGH (ref 70–99)
Potassium: 4.1 mmol/L (ref 3.5–5.1)
Sodium: 135 mmol/L (ref 135–145)

## 2019-07-12 LAB — CBC
HCT: 27.1 % — ABNORMAL LOW (ref 36.0–46.0)
Hemoglobin: 7.7 g/dL — ABNORMAL LOW (ref 12.0–15.0)
MCH: 20.3 pg — ABNORMAL LOW (ref 26.0–34.0)
MCHC: 28.4 g/dL — ABNORMAL LOW (ref 30.0–36.0)
MCV: 71.5 fL — ABNORMAL LOW (ref 80.0–100.0)
Platelets: 272 10*3/uL (ref 150–400)
RBC: 3.79 MIL/uL — ABNORMAL LOW (ref 3.87–5.11)
RDW: 23.9 % — ABNORMAL HIGH (ref 11.5–15.5)
WBC: 13.8 10*3/uL — ABNORMAL HIGH (ref 4.0–10.5)
nRBC: 0.6 % — ABNORMAL HIGH (ref 0.0–0.2)

## 2019-07-12 LAB — HEMOGLOBIN A1C
Hgb A1c MFr Bld: 5.4 % (ref 4.8–5.6)
Mean Plasma Glucose: 108.28 mg/dL

## 2019-07-12 NOTE — Consult Note (Signed)
[facility] Gastroenterology Consultation Note  Referring Provider: Dr. Doylene Canard Primary Care Physician:  Dixie Dials, MD  Reason for Consultation:  Iron-deficiency anemia  HPI: Connie Rose is a 79 y.o. female admitted with progressive weakness and Hgb ~ 4.5.  Patient history per grandson [name].  Patient has had insidious and progress weakness.  Some weight loss and intermittent constipation.  No prior colonoscopy.  No known family history of colon cancer or polyps.  No nausea, vomiting, dysphagia, hematemesis, melena, hematochezia.  Iron deficiency indices.   Past Medical History:  Diagnosis Date  . Symptomatic anemia 07/11/2019    No past surgical history on file.  Prior to Admission medications   Medication Sig Start Date End Date Taking? Authorizing Provider  amLODipine (NORVASC) 5 MG tablet Take 5 mg by mouth daily.   Yes [provider]  aspirin EC 81 MG tablet Take 81 mg by mouth daily.   Yes [provider]  atenolol (TENORMIN) 25 MG tablet Take 0.5 tablets (12.5 mg total) by mouth daily. 10/12/12  Yes Dixie Dials, MD  atorvastatin (LIPITOR) 20 MG tablet Take 20 mg by mouth daily.   Yes [provider]  furosemide (LASIX) 20 MG tablet Take 1 tablet (20 mg total) by mouth daily. 10/12/12  Yes Dixie Dials, MD  losartan (COZAAR) 50 MG tablet Take 50 mg by mouth daily.   Yes [provider]  potassium chloride (KLOR-CON) 10 MEQ tablet Take 10 mEq by mouth daily.   Yes [provider]  Cholecalciferol (VITAMIN D3) 2000 UNITS capsule Take 2,000 Units by mouth daily.    [provider]  potassium chloride SA (K-DUR,KLOR-CON) 20 MEQ tablet Take 1 tablet (20 mEq total) by mouth daily. Patient not taking: Reported on 07/11/2019 10/12/12   Dixie Dials, MD    Current Facility-Administered Medications  Medication Dose Route Frequency Provider Last Rate Last Admin  . 0.9 %  sodium chloride infusion   Intravenous Continuous Dixie Dials, MD 50 mL/hr at 07/11/19 2056 New Bag at 07/11/19 2056  . amLODipine (NORVASC) tablet 5 mg  5 mg Oral Daily Dixie Dials, MD   5 mg at 07/12/19 1005  . atenolol (TENORMIN) tablet 12.5 mg  12.5 mg Oral Daily Dixie Dials, MD   12.5 mg at 07/12/19 1006  . atorvastatin (LIPITOR) tablet 20 mg  20 mg Oral Daily Dixie Dials, MD   20 mg at 07/12/19 1006  . cholecalciferol (VITAMIN D3) tablet 2,000 Units  2,000 Units Oral Daily Dixie Dials, MD   2,000 Units at 07/12/19 1007  . losartan (COZAAR) tablet 50 mg  50 mg Oral Daily Dixie Dials, MD   50 mg at 07/12/19 1004  . pantoprazole (PROTONIX) injection 40 mg  40 mg Intravenous Q12H Joy, Shawn C, PA-C   40 mg at 07/12/19 1007    Allergies as of 07/11/2019  . (No Known Allergies)    No family history on file.  Social History   Socioeconomic History  . Marital status: Married    Spouse name: Not on file  . Number of children: Not on file  . Years of education: Not on file  . Highest education level: Not on file  Occupational History  . Not on file  Tobacco Use  . Smoking status: Never Smoker  . Smokeless tobacco: Never Used  Substance and Sexual Activity  . Alcohol use: Not on file  . Drug use: No  . Sexual activity: Never  Other Topics Concern  . Not on  file  Social History Narrative  . Not on file   Social Determinants of Health   Financial Resource Strain:   . Difficulty of Paying Living Expenses:   Food Insecurity:   . Worried About Programme researcher, broadcasting/film/video in the Last Year:   . Barista in the Last Year:   Transportation Needs:   . Freight forwarder (Medical):   Marland Kitchen Lack of Transportation (Non-Medical):   Physical Activity:   . Days of Exercise per Week:   . Minutes of Exercise per Session:   Stress:   . Feeling of Stress :   Social Connections:   . Frequency of Communication with Friends and Family:   . Frequency of Social Gatherings with Friends and Family:   . Attends Religious Services:   .  Active Member of Clubs or Organizations:   . Attends Banker Meetings:   Marland Kitchen Marital Status:   Intimate Partner Violence:   . Fear of Current or Ex-Partner:   . Emotionally Abused:   Marland Kitchen Physically Abused:   . Sexually Abused:     Review of Systems: As per HPI, all others negative  Physical Exam: Vital signs in last 24 hours: Temp:  [97.7 F (36.5 C)-98.8 F (37.1 C)] 98.7 F (37.1 C) (03/17 0100) Pulse Rate:  [73-100] 86 (03/17 1006) Resp:  [16-36] 23 (03/17 0500) BP: (110-149)/(53-98) 135/63 (03/17 1004) SpO2:  [100 %] 100 % (03/17 0500) Weight:  [68.9 kg] 68.9 kg (03/17 0500) Last BM Date: 07/10/19 General:   Alert,  Well-developed, well-nourished, pleasant and cooperative in NAD Head:  Normocephalic and atraumatic. Eyes:  Sclera clear, no icterus.   Conjunctiva pink. Ears:  Normal auditory acuity. Nose:  No deformity, discharge,  or lesions. Mouth:  No deformity or lesions.  Oropharynx pink & moist. Neck:  Supple; no masses or thyromegaly. Lungs:  Clear throughout to auscultation.   No wheezes, crackles, or rhonchi. No acute distress. Heart:  Regular rate and rhythm; no murmurs, clicks, rubs,  or gallops. Abdomen:  Soft, nontender and nondistended. No masses, hepatosplenomegaly or hernias noted. Normal bowel sounds, without guarding, and without rebound.     Msk:  Symmetrical without gross deformities. Normal posture. Pulses:  Normal pulses noted. Extremities:  Without clubbing or edema. Neurologic:  Alert and  oriented x4;  grossly normal neurologically. Skin:  Intact without significant lesions or rashes. Cervical Nodes:  No significant cervical adenopathy. Psych:  Alert and cooperative. Normal mood and affect.   Lab Results: Recent Labs    07/11/19 1914 07/12/19 0420  WBC 12.5* 13.8*  HGB 4.5* 7.7*  HCT 18.5* 27.1*  PLT 331 272   BMET Recent Labs    07/11/19 1914 07/12/19 0420  NA 134* 135  K 4.2 4.1  CL 101 105  CO2 24 21*  GLUCOSE 214*  149*  BUN 22 22  CREATININE 0.93 0.86  CALCIUM 9.1 8.6*   LFT Recent Labs    07/11/19 1914  PROT 6.3*  ALBUMIN 3.5  AST 19  ALT 16  ALKPHOS 57  BILITOT 0.7   PT/INR Recent Labs    07/11/19 2029  LABPROT 14.1  INR 1.1    Studies/Results: No results found.  Impression:  1.  Iron-deficiency anemia.  No overt GI bleeding. 2.  Weight loss? 3.  Change in bowel habits?  Plan:  1.  I have discussed case with patient and grandson ([name], [phone]). 2.  Patient needs colonoscopy and, if negative, endoscopy.  I advised this can be do tomorrow as inpatient or next week as outpatient.   3.  They would prefer outpatient evaluation; will do repeat CBC next Monday (if significantly lower would need to come back to hospital; patient/grandson are aware) and tentative colonoscopy outpatient [facility] next Wednesday. 4.  [facility] will sign-off; please call with questions.   LOS: 1 day   Holbert Caples M  07/12/2019, 2:04 PM  Cell [phone] If no answer or after 5 PM call [phone]

## 2019-07-12 NOTE — Care Management CC44 (Signed)
Condition Code 44 Documentation Completed  Patient Details  Name: Connie Rose MRN: [id number] Date of Birth: 1941/02/15   Condition Code 44 given:  Yes Patient signature on Condition Code 44 notice:  Yes Documentation of 2 MD's agreement:  Yes Code 44 added to claim:  Yes    Gala Lewandowsky, RN 07/12/2019, 5:37 PM

## 2019-07-12 NOTE — Care Management Obs Status (Signed)
MEDICARE OBSERVATION STATUS NOTIFICATION   Patient Details  Name: ODYSSEY VASBINDER MRN: [id number] Date of Birth: 05-05-1940   Medicare Observation Status Notification Given:  Yes    Gala Lewandowsky, RN 07/12/2019, 5:37 PM

## 2019-07-12 NOTE — Progress Notes (Signed)
Ref: Connie Cobb, MD   Subjective:  Feeling better post PRBC transfusion. Hgb up to 7.7 gm. VS stable with 100 % O2 sat. And BP 130's/60's. Denies rectal bleed except one incidence 4 months ago.  Objective:  Vital Signs in the last 24 hours: Temp:  [97.7 F (36.5 C)-98.8 F (37.1 C)] 98.7 F (37.1 C) (03/17 0100) Pulse Rate:  [73-100] 86 (03/17 1006) Cardiac Rhythm: Normal sinus rhythm (03/17 0839) Resp:  [16-36] 23 (03/17 0500) BP: (110-149)/(53-98) 135/63 (03/17 1004) SpO2:  [100 %] 100 % (03/17 0500) Weight:  [68.9 kg] 68.9 kg (03/17 0500)  Physical Exam: BP Readings from Last 1 Encounters:  07/12/19 135/63     Wt Readings from Last 1 Encounters:  07/12/19 68.9 kg    Weight change:  Body mass index is 26.07 kg/m. HEENT: [place]/AT, Eyes-Brown, PERL, EOMI, Conjunctiva-Pale, Sclera-Non-icteric Neck: No JVD, No bruit, Trachea midline. Lungs:  Clear, Bilateral. Cardiac:  Regular rhythm, normal S1 and S2, no S3. II/VI systolic murmur. Abdomen:  Soft, non-tender. BS present. Extremities:  1 + edema present. No cyanosis. No clubbing. CNS: AxOx3, Cranial nerves grossly intact, moves all 4 extremities.  Skin: Warm and dry.   Intake/Output from previous day: 03/16 0701 - 03/17 0700 In: 100 [Blood:100] Out: -     Lab Results: BMET    Component Value Date/Time   NA 135 07/12/2019 0420   NA 134 (L) 07/11/2019 1914   NA 136 10/12/2012 0453   K 4.1 07/12/2019 0420   K 4.2 07/11/2019 1914   K 4.1 10/12/2012 0453   CL 105 07/12/2019 0420   CL 101 07/11/2019 1914   CL 101 10/12/2012 0453   CO2 21 (L) 07/12/2019 0420   CO2 24 07/11/2019 1914   CO2 26 10/12/2012 0453   GLUCOSE 149 (H) 07/12/2019 0420   GLUCOSE 214 (H) 07/11/2019 1914   GLUCOSE 118 (H) 10/12/2012 0453   BUN 22 07/12/2019 0420   BUN 22 07/11/2019 1914   BUN 18 10/12/2012 0453   CREATININE 0.86 07/12/2019 0420   CREATININE 0.93 07/11/2019 1914   CREATININE 0.85 10/12/2012 0453   CALCIUM 8.6 (L)  07/12/2019 0420   CALCIUM 9.1 07/11/2019 1914   CALCIUM 8.8 10/12/2012 0453   GFRNONAA >60 07/12/2019 0420   GFRNONAA 58 (L) 07/11/2019 1914   GFRNONAA 66 (L) 10/12/2012 0453   GFRAA >60 07/12/2019 0420   GFRAA >60 07/11/2019 1914   GFRAA 76 (L) 10/12/2012 0453   CBC    Component Value Date/Time   WBC 13.8 (H) 07/12/2019 0420   RBC 3.79 (L) 07/12/2019 0420   HGB 7.7 (L) 07/12/2019 0420   HCT 27.1 (L) 07/12/2019 0420   PLT 272 07/12/2019 0420   MCV 71.5 (L) 07/12/2019 0420   MCH 20.3 (L) 07/12/2019 0420   MCHC 28.4 (L) 07/12/2019 0420   RDW 23.9 (H) 07/12/2019 0420   LYMPHSABS 2.9 10/10/2012 1921   MONOABS 0.6 10/10/2012 1921   EOSABS 0.3 10/10/2012 1921   BASOSABS 0.0 10/10/2012 1921   HEPATIC Function Panel Recent Labs    07/11/19 1914  PROT 6.3*   HEMOGLOBIN A1C No components found for: HGA1C,  MPG CARDIAC ENZYMES Lab Results  Component Value Date   TROPONINI <0.30 10/11/2012   TROPONINI <0.30 10/11/2012   TROPONINI <0.30 10/10/2012   BNP No results for input(s): PROBNP in the last 8760 hours. TSH No results for input(s): TSH in the last 8760 hours. CHOLESTEROL No results for input(s): CHOL in the last 8760  hours.  Scheduled Meds: . amLODipine  5 mg Oral Daily  . atenolol  12.5 mg Oral Daily  . atorvastatin  20 mg Oral Daily  . cholecalciferol  2,000 Units Oral Daily  . losartan  50 mg Oral Daily  . pantoprazole (PROTONIX) IV  40 mg Intravenous Q12H   Continuous Infusions: . sodium chloride 50 mL/hr at 07/11/19 2056   PRN Meds:.  Assessment/Plan: Symptomatic anemia Possible Chronic GI bleed Iron deficiency anemia Type 2 DM HTN Obesity  Increase activity. Awaiting GI consult.  OP work up per GI if no endoscopy room available. Recheck CBC in AM. Continue pantoprazole.   LOS: 1 day   Time spent including chart review, lab review, examination, discussion with patient : 30 min   Dixie Dials  MD  07/12/2019, 10:31 AM

## 2019-07-13 DIAGNOSIS — D649 Anemia, unspecified: Secondary | ICD-10-CM | POA: Diagnosis not present

## 2019-07-13 LAB — BASIC METABOLIC PANEL
Anion gap: 8 (ref 5–15)
BUN: 20 mg/dL (ref 8–23)
CO2: 22 mmol/L (ref 22–32)
Calcium: 8.5 mg/dL — ABNORMAL LOW (ref 8.9–10.3)
Chloride: 109 mmol/L (ref 98–111)
Creatinine, Ser: 0.89 mg/dL (ref 0.44–1.00)
GFR calc Af Amer: >60 mL/min (ref >60)
GFR calc non Af Amer: >60 mL/min (ref >60)
Glucose, Bld: 134 mg/dL — ABNORMAL HIGH (ref 70–99)
Potassium: 4 mmol/L (ref 3.5–5.1)
Sodium: 139 mmol/L (ref 135–145)

## 2019-07-13 LAB — TYPE AND SCREEN
ABO/RH(D): B NEG
Antibody Screen: NEGATIVE
Unit division: 0
Unit division: 0

## 2019-07-13 LAB — CBC
HCT: 26.2 % — ABNORMAL LOW (ref 36.0–46.0)
Hemoglobin: 7.4 g/dL — ABNORMAL LOW (ref 12.0–15.0)
MCH: 19.9 pg — ABNORMAL LOW (ref 26.0–34.0)
MCHC: 28.2 g/dL — ABNORMAL LOW (ref 30.0–36.0)
MCV: 70.4 fL — ABNORMAL LOW (ref 80.0–100.0)
Platelets: 254 10*3/uL (ref 150–400)
RBC: 3.72 MIL/uL — ABNORMAL LOW (ref 3.87–5.11)
RDW: 23.3 % — ABNORMAL HIGH (ref 11.5–15.5)
WBC: 13.7 10*3/uL — ABNORMAL HIGH (ref 4.0–10.5)
nRBC: 0.4 % — ABNORMAL HIGH (ref 0.0–0.2)

## 2019-07-13 LAB — BPAM RBC
Blood Product Expiration Date: 202103242359
Blood Product Expiration Date: 202103242359
ISSUE DATE / TIME: 202103162126
ISSUE DATE / TIME: 202103170039
Unit Type and Rh: 1700
Unit Type and Rh: 1700

## 2019-07-13 MED ORDER — PANTOPRAZOLE SODIUM 40 MG PO TBEC: 40.0000 mg | DELAYED_RELEASE_TABLET | Freq: Two times a day (BID) | ORAL | Status: DC

## 2019-07-13 MED ORDER — PANTOPRAZOLE SODIUM 40 MG PO TBEC: 40.0000 mg | DELAYED_RELEASE_TABLET | Freq: Two times a day (BID) | ORAL | 3 refills | Status: AC

## 2019-07-13 NOTE — Discharge Summary (Signed)
Physician Discharge Summary  Patient ID: SHENAYA LEBO MRN: [id number] DOB/AGE: 1941-04-06 79 y.o.  Admit date: 07/11/2019 Discharge date: 07/13/2019  Admission Diagnoses: Acute on chronic GI bleed Symptomatic anemia Type 2 DM HTN Obesity  Discharge Diagnoses:  Principle Problem: Symptomatic anemia Active Problems:   Possible Chronic GI bleed   Iron deficiency anemia   Hyperglycemia, type 2 DM ruled out   Hypertension   Obesity  Discharged Condition: fair  Hospital Course: 79 years old female presented with severe anemia with weakness and dizziness. She had remote rectal bleed and had started Aleve daily x 1 week for increasing arthritic pain.  She received 2 units of PRBC for Hgb of 4.5 gm. Her Hgb at time of discharge was stable at 7.4 gm. She had GI evaluation and patient and consultant have agreed to GI procedure on OP basis in near future. She will hold iron supplements for now.  Most of her home medications are resumed except aspirin. Pantoprazole bid was added. She was able to ambulate well. She was discharged home in stable condition and will see me in 1 week and will see GI doctor in few days as arranged.  Consults: cardiology and GI  Significant Diagnostic Studies: labs: Hgb 4.5 to 7.4 gm. Normal electrolytes with hyperglycemia but Hgb A1C was 5.4 %.  Low iron level of 11 mcg and 2 % saturation.  EKG: NSR and low voltage.  Treatments: IV hydration and 2 units packed RBC of B neg group.  Discontinuation of aspirin and Aleve.  Addition of pantoprazole 40 mg. twice daily.Marland Kitchen  Discharge Exam: Blood pressure 134/60, pulse 67, temperature 98.1 F (36.7 C), temperature source Oral, resp. rate 18, weight 68.9 kg, SpO2 100 %. General appearance: alert, cooperative and appears stated age. Head: Normocephalic, atraumatic. Eyes: Brown eyes, pale conjunctiva, corneas clear. PERRL, EOM's intact.  Neck: No adenopathy, no carotid bruit, no JVD, supple, symmetrical, trachea  midline and thyroid not enlarged. Resp: Clear to auscultation bilaterally. Cardio: Regular rate and rhythm, S1, S2 normal, II/VI systolic murmur, no click, rub or gallop. GI: Soft, non-tender; bowel sounds normal; no organomegaly. Extremities: 1 + edema, no cyanosis or clubbing. Skin: Warm and dry.  Neurologic: Alert and oriented X 3, normal strength and tone. Normal coordination and gait.  Disposition: Discharge disposition: 01-Home or Self Care        Allergies as of 07/13/2019   No Known Allergies     Medication List    STOP taking these medications   aspirin EC 81 MG tablet   potassium chloride SA 20 MEQ tablet Commonly known as: KLOR-CON     TAKE these medications   amLODipine 5 MG tablet Commonly known as: NORVASC Take 5 mg by mouth daily.   atenolol 25 MG tablet Commonly known as: TENORMIN Take 0.5 tablets (12.5 mg total) by mouth daily.   atorvastatin 20 MG tablet Commonly known as: LIPITOR Take 20 mg by mouth daily.   furosemide 20 MG tablet Commonly known as: Lasix Take 1 tablet (20 mg total) by mouth daily.   losartan 50 MG tablet Commonly known as: COZAAR Take 50 mg by mouth daily.   pantoprazole 40 MG tablet Commonly known as: PROTONIX Take 1 tablet (40 mg total) by mouth 2 (two) times daily.   potassium chloride 10 MEQ tablet Commonly known as: KLOR-CON Take 10 mEq by mouth daily.   Vitamin D3 50 MCG (2000 UT) capsule Take 2,000 Units by mouth daily.      Follow-up Information  Orpah Cobb, MD. Schedule an appointment as soon as possible for a visit in 1 week(s).   Specialty: Cardiology Contact information: [address] [place] [address] [phone]           Time spent: Review of old chart, current chart, lab, x-ray, cardiac tests and discussion with patient over 60 minutes.  Signed: Ricki Rodriguez 07/13/2019, 11:08 AM

## 2019-09-15 ENCOUNTER — Other Ambulatory Visit: Payer: Self-pay | Admitting: Gastroenterology

## 2019-09-15 DIAGNOSIS — D509 Iron deficiency anemia, unspecified: Secondary | ICD-10-CM

## 2019-10-02 ENCOUNTER — Inpatient Hospital Stay: Admission: RE | Admit: 2019-10-02 | Payer: Medicare Other | Source: Ambulatory Visit

## 2020-02-10 ENCOUNTER — Ambulatory Visit: Payer: Medicare Other | Attending: Internal Medicine

## 2020-02-10 DIAGNOSIS — Z23 Encounter for immunization: Secondary | ICD-10-CM

## 2020-02-10 NOTE — Progress Notes (Signed)
   [facility]  Name:  Connie Rose    MRN: [id number] DOB: May 29, 1940  02/10/2020  Connie Rose was observed post Covid-19 immunization for 15 minutes without incident. She was provided with Vaccine Information Sheet and instruction to access the V-Safe system.   Connie Rose was instructed to call 911 with any severe reactions post vaccine: Marland Kitchen Difficulty breathing  . Swelling of face and throat  . A fast heartbeat  . A bad rash all over body  . Dizziness and weakness

## 2020-04-25 DIAGNOSIS — Z01818 Encounter for other preprocedural examination: Secondary | ICD-10-CM | POA: Diagnosis not present

## 2022-11-02 DIAGNOSIS — E7849 Other hyperlipidemia: Secondary | ICD-10-CM | POA: Diagnosis not present

## 2022-11-02 DIAGNOSIS — I10 Essential (primary) hypertension: Secondary | ICD-10-CM | POA: Diagnosis not present

## 2022-11-02 DIAGNOSIS — I425 Other restrictive cardiomyopathy: Secondary | ICD-10-CM | POA: Diagnosis not present

## 2023-04-02 DIAGNOSIS — Z79899 Other long term (current) drug therapy: Secondary | ICD-10-CM | POA: Diagnosis not present

## 2023-04-02 DIAGNOSIS — E7849 Other hyperlipidemia: Secondary | ICD-10-CM | POA: Diagnosis not present

## 2023-04-02 DIAGNOSIS — E786 Lipoprotein deficiency: Secondary | ICD-10-CM | POA: Diagnosis not present

## 2023-04-02 DIAGNOSIS — E1165 Type 2 diabetes mellitus with hyperglycemia: Secondary | ICD-10-CM | POA: Diagnosis not present

## 2023-04-02 DIAGNOSIS — D649 Anemia, unspecified: Secondary | ICD-10-CM | POA: Diagnosis not present

## 2023-04-06 DIAGNOSIS — I425 Other restrictive cardiomyopathy: Secondary | ICD-10-CM | POA: Diagnosis not present

## 2023-04-06 DIAGNOSIS — E7849 Other hyperlipidemia: Secondary | ICD-10-CM | POA: Diagnosis not present

## 2023-04-06 DIAGNOSIS — I10 Essential (primary) hypertension: Secondary | ICD-10-CM | POA: Diagnosis not present

## 2023-10-12 DIAGNOSIS — E7849 Other hyperlipidemia: Secondary | ICD-10-CM | POA: Diagnosis not present

## 2023-10-12 DIAGNOSIS — I425 Other restrictive cardiomyopathy: Secondary | ICD-10-CM | POA: Diagnosis not present

## 2023-10-12 DIAGNOSIS — I10 Essential (primary) hypertension: Secondary | ICD-10-CM | POA: Diagnosis not present
